# Patient Record
Sex: Female | Born: 1980 | Race: Black or African American | Hispanic: No | Marital: Single | Smoking: Current every day smoker
Health system: Southern US, Community
[De-identification: ages and names within clinical notes are randomized; demographics above are authoritative.]

## PROBLEM LIST (undated history)

## (undated) DIAGNOSIS — J40 Bronchitis, not specified as acute or chronic: Secondary | ICD-10-CM

---

## 2001-10-22 ENCOUNTER — Emergency Department (HOSPITAL_COMMUNITY): Admission: EM | Admit: 2001-10-22 | Discharge: 2001-10-22 | Payer: Self-pay | Admitting: Emergency Medicine

## 2017-12-02 ENCOUNTER — Emergency Department (HOSPITAL_COMMUNITY)
Admission: EM | Admit: 2017-12-02 | Discharge: 2017-12-03 | Disposition: A | Discharge status: Home / Self Care | Payer: Self-pay | Attending: Emergency Medicine | Admitting: Emergency Medicine

## 2017-12-02 ENCOUNTER — Encounter (HOSPITAL_COMMUNITY): Payer: Self-pay | Admitting: Emergency Medicine

## 2017-12-02 ENCOUNTER — Other Ambulatory Visit: Payer: Self-pay

## 2017-12-02 DIAGNOSIS — F17200 Nicotine dependence, unspecified, uncomplicated: Secondary | ICD-10-CM | POA: Insufficient documentation

## 2017-12-02 DIAGNOSIS — L0231 Cutaneous abscess of buttock: Secondary | ICD-10-CM | POA: Insufficient documentation

## 2017-12-02 HISTORY — DX: Bronchitis, not specified as acute or chronic: J40

## 2017-12-02 MED ORDER — LIDOCAINE-EPINEPHRINE (PF) 2 %-1:200000 IJ SOLN
10.0000 mL | Freq: Once | INTRAMUSCULAR | Status: AC
  Administered 2017-12-02: 10 mL
  Filled 2017-12-02: qty 20

## 2017-12-02 NOTE — ED Provider Notes (Signed)
[facility] Provider Note   CSN: [id number] Arrival date & time: 12/02/17  2000     History   Chief Complaint Chief Complaint  Patient presents with  . Abscess    HPI Terry Ortiz is a 36 y.o. female to the ED with acute onset of abscess to left buttock that she noticed yesterday.  Patient states she soaked in warm water and Epsom salt, however today has become more painful.  Denies fever, chills, nausea, vomiting.  Denies pain with defecation.  States she is only had one abscess in the past, and it was in her left axilla.  No other complaints.  The history is provided by the patient.    Past Medical History:  Diagnosis Date  . Bronchitis     There are no active problems to display for this patient.   History reviewed. No pertinent surgical history.  OB History    No data available       Home Medications    Prior to Admission medications   Not on File    Family History No family history on file.  Social History Social History   Tobacco Use  . Smoking status: Current Every Day Smoker  . Smokeless tobacco: Never Used  Substance Use Topics  . Alcohol use: Yes  . Drug use: Not on file     Allergies   Patient has no known allergies.   Review of Systems Review of Systems  Constitutional: Negative for chills and fever.  Gastrointestinal:       No pain with defecation  Skin: Positive for color change.     Physical Exam Updated Vital Signs BP (!) 125/93 (BP Location: Right Arm)   Pulse 87   Temp 98.3 F (36.8 C) (Oral)   Resp 18   Ht 5\' 7"  (1.702 m)   LMP 11/25/2017   SpO2 100%   Physical Exam  Constitutional: She appears well-developed and well-nourished.  HENT:  Head: Normocephalic and atraumatic.  Eyes: Conjunctivae are normal.  Cardiovascular: Intact distal pulses.  Pulmonary/Chest: Effort normal.  Genitourinary:     Genitourinary Comments: Then performed with female chaperone present.   Tender, erythematous, fluctuant mass to left gluteal cleft about 3 inches away from anus.  Not actively draining.  Psychiatric: She has a normal mood and affect. Her behavior is normal.  Nursing note and vitals reviewed.    ED Treatments / Results  Labs (all labs ordered are listed, but only abnormal results are displayed) Labs Reviewed - No data to display  EKG  EKG Interpretation None       Radiology No results found.  Procedures .Marland Kitchen.Incision and Drainage Date/Time: 12/03/2017 12:57 AM Performed by: Robinson, [place] N, PA-C Authorized by: Robinson, [place] N, PA-C   Consent:    Consent obtained:  Verbal   Consent given by:  Patient   Risks discussed:  Bleeding, incomplete drainage and pain   Alternatives discussed:  No treatment and alternative treatment Location:    Type:  Abscess   Size:  2cm   Location:  Anogenital   Anogenital location:  Gluteal cleft Pre-procedure details:    Skin preparation:  Betadine Anesthesia (see MAR for exact dosages):    Anesthesia method:  Local infiltration   Local anesthetic:  Lidocaine 2% WITH epi Procedure type:    Complexity:  Simple Procedure details:    Incision types:  Single straight   Incision depth:  Dermal   Scalpel blade:  11   Wound  management:  Probed and deloculated and irrigated with saline   Drainage:  Bloody and purulent   Drainage amount:  Moderate   Wound treatment:  Wound left open   Packing materials:  None Post-procedure details:    Patient tolerance of procedure:  Tolerated well, no immediate complications   (including critical care time)  Medications Ordered in ED Medications  lidocaine-EPINEPHrine (XYLOCAINE W/EPI) 2 %-1:200000 (PF) injection 10 mL (10 mLs Infiltration Given 12/02/17 2358)     Initial Impression / Assessment and Plan / ED Course  I have reviewed the triage vital signs and the nursing notes.  Pertinent labs & imaging results that were available during my care of the patient were  reviewed by me and considered in my medical decision making (see chart for details).     Patient with skin abscess amenable to incision and drainage.  Abscess was not large enough to warrant packing or drain,  wound recheck in 2 days. Encouraged home warm soaks and flushing.  Mild signs of cellulitis is surrounding skin.  Will d/c to home.  No antibiotic therapy is indicated.  Patient discussed with and seen by Dr. Erma HeritageIsaacs.  Discussed results, findings, treatment and follow up. Patient advised of return precautions. Patient verbalized understanding and agreed with plan.   Final Clinical Impressions(s) / ED Diagnoses   Final diagnoses:  Abscess of buttock, left    ED Discharge Orders    None       Robinson, [place] N, [place]-C 12/03/17 0059    Shaune PollackIsaacs, [place], MD 12/03/17 [phone]

## 2017-12-02 NOTE — ED Notes (Signed)
See provider assessment 

## 2017-12-02 NOTE — ED Triage Notes (Signed)
Pt c/o abscess to left buttock that she noticed yesterday, soaked area in warm water and epsom salt with no relief.

## 2017-12-03 NOTE — Discharge Instructions (Signed)
Please read instructions below.  °Keep your wound clean and covered. °Soak/flush your wound with warm water, multiple times per day. °You can take Advil/ibuprofen every 6 hours as needed for pain. °Follow up with your primary care or urgent care for wound recheck in 2 days.  °Return to the ER for fever, worsening redness, or new or worsening symptoms. ° °

## 2020-10-26 ENCOUNTER — Emergency Department (HOSPITAL_COMMUNITY): Payer: Self-pay

## 2020-10-26 ENCOUNTER — Encounter (HOSPITAL_COMMUNITY): Payer: Self-pay | Admitting: Emergency Medicine

## 2020-10-26 ENCOUNTER — Emergency Department (HOSPITAL_COMMUNITY)
Admission: EM | Admit: 2020-10-26 | Discharge: 2020-10-26 | Disposition: A | Discharge status: Home / Self Care | Payer: Self-pay | Attending: Emergency Medicine | Admitting: Emergency Medicine

## 2020-10-26 DIAGNOSIS — R42 Dizziness and giddiness: Secondary | ICD-10-CM | POA: Insufficient documentation

## 2020-10-26 DIAGNOSIS — Z5321 Procedure and treatment not carried out due to patient leaving prior to being seen by health care provider: Secondary | ICD-10-CM | POA: Insufficient documentation

## 2020-10-26 DIAGNOSIS — R079 Chest pain, unspecified: Secondary | ICD-10-CM | POA: Insufficient documentation

## 2020-10-26 DIAGNOSIS — Z20822 Contact with and (suspected) exposure to covid-19: Secondary | ICD-10-CM | POA: Insufficient documentation

## 2020-10-26 DIAGNOSIS — R0602 Shortness of breath: Secondary | ICD-10-CM | POA: Insufficient documentation

## 2020-10-26 LAB — BASIC METABOLIC PANEL
Anion gap: 15 (ref 5–15)
BUN: 11 mg/dL (ref 6–20)
CO2: 17 mmol/L — ABNORMAL LOW (ref 22–32)
Calcium: 9.7 mg/dL (ref 8.9–10.3)
Chloride: 107 mmol/L (ref 98–111)
Creatinine, Ser: 0.91 mg/dL (ref 0.44–1.00)
GFR, Estimated: >60 mL/min (ref >60)
Glucose, Bld: 95 mg/dL (ref 70–99)
Potassium: 3.5 mmol/L (ref 3.5–5.1)
Sodium: 139 mmol/L (ref 135–145)

## 2020-10-26 LAB — CBC
HCT: 39.6 % (ref 36.0–46.0)
Hemoglobin: 13 g/dL (ref 12.0–15.0)
MCH: 28.8 pg (ref 26.0–34.0)
MCHC: 32.8 g/dL (ref 30.0–36.0)
MCV: 87.8 fL (ref 80.0–100.0)
Platelets: 369 10*3/uL (ref 150–400)
RBC: 4.51 MIL/uL (ref 3.87–5.11)
RDW: 13.2 % (ref 11.5–15.5)
WBC: 7.7 10*3/uL (ref 4.0–10.5)
nRBC: 0 % (ref 0.0–0.2)

## 2020-10-26 LAB — RESPIRATORY PANEL BY RT PCR (FLU A&B, COVID)
Influenza A by PCR: NEGATIVE
Influenza B by PCR: NEGATIVE
SARS Coronavirus 2 by RT PCR: NEGATIVE

## 2020-10-26 LAB — I-STAT BETA HCG BLOOD, ED (MC, WL, AP ONLY): I-stat hCG, quantitative: <5 m[IU]/mL (ref <5)

## 2020-10-26 LAB — TROPONIN I (HIGH SENSITIVITY): Troponin I (High Sensitivity): <2 ng/L (ref <18)

## 2020-10-26 NOTE — ED Triage Notes (Signed)
Pt endorses SOB since Thursday. No relief with inhaler or mucinex. Endorses CP and lightheadedness. Pt very anxious and states it feels better when standing.

## 2020-10-26 NOTE — ED Notes (Signed)
Pt states she is leaving  

## 2021-07-13 ENCOUNTER — Emergency Department (HOSPITAL_COMMUNITY)
Admission: EM | Admit: 2021-07-13 | Discharge: 2021-07-13 | Discharge status: Against Medical Advice | Payer: Self-pay | Attending: Emergency Medicine | Admitting: Emergency Medicine

## 2021-07-13 ENCOUNTER — Other Ambulatory Visit: Payer: Self-pay

## 2021-07-13 ENCOUNTER — Encounter (HOSPITAL_COMMUNITY): Payer: Self-pay

## 2021-07-13 ENCOUNTER — Emergency Department (HOSPITAL_COMMUNITY): Payer: Self-pay

## 2021-07-13 DIAGNOSIS — R202 Paresthesia of skin: Secondary | ICD-10-CM | POA: Insufficient documentation

## 2021-07-13 DIAGNOSIS — Z5321 Procedure and treatment not carried out due to patient leaving prior to being seen by health care provider: Secondary | ICD-10-CM | POA: Insufficient documentation

## 2021-07-13 NOTE — ED Provider Notes (Signed)
Emergency Medicine Provider Triage Evaluation Note  Terry Ortiz , a 40 y.o. female  was evaluated in triage.  Pt complains of 3 months of progressively enlarging lump on right shoulder, now painful with tingling down the right arm. No injuries. Relieved with ibuprofen, worsening with repetitive movement.   Review of Systems  Positive: Shoulder pain, swelling, tingling/numbness in arm Negative: Weakness  Physical Exam  There were no vitals taken for this visit. Gen:   Awake, no distress   Resp:  Normal effort  MSK:   Moves extremities without difficulty  Other:  Soft tissue mass over the right posterior shoulder, mildly TTP. 2+ radial pulses,   Medical Decision Making  Medically screening exam initiated at 2:50 PM.  Appropriate orders placed.  Avantika Shere was informed that the remainder of the evaluation will be completed by another provider, this initial triage assessment does not replace that evaluation, and the importance of remaining in the ED until their evaluation is complete.  This chart was dictated using voice recognition software, Dragon. Despite the best efforts of this provider to proofread and correct errors, errors may still occur which can change documentation meaning.    Sherrilee Gilles 07/13/21 1454    Gloris Manchester, MD 07/13/21 [phone]

## 2021-07-13 NOTE — ED Triage Notes (Signed)
Pt reports knot to right shoulder x3 months and reports numbness with movement of right arm.

## 2023-02-13 IMAGING — CR DG SHOULDER 2+V*R*
3 series · 3 of 3 positions shown · non-contrast
Comparison: None.

CLINICAL DATA: Progressive enlargement of mass on right superior
posterior shoulder, now painful with numbness and tingling rating
down right arm.

EXAM:
RIGHT SHOULDER - 2+ VIEW

[w shoulder external right]
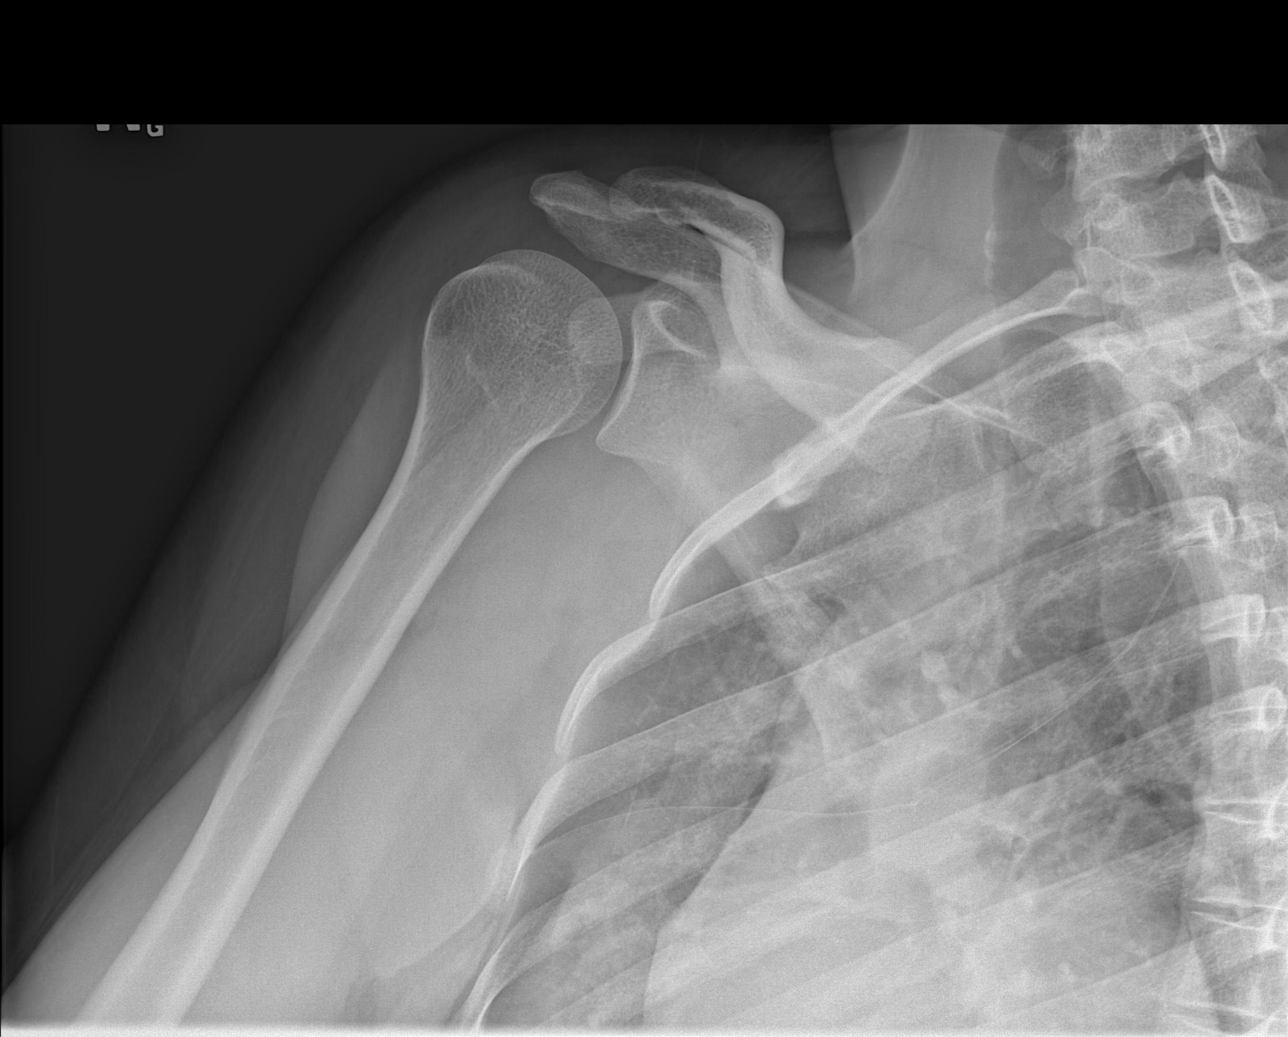

[w shoulder y-view right]
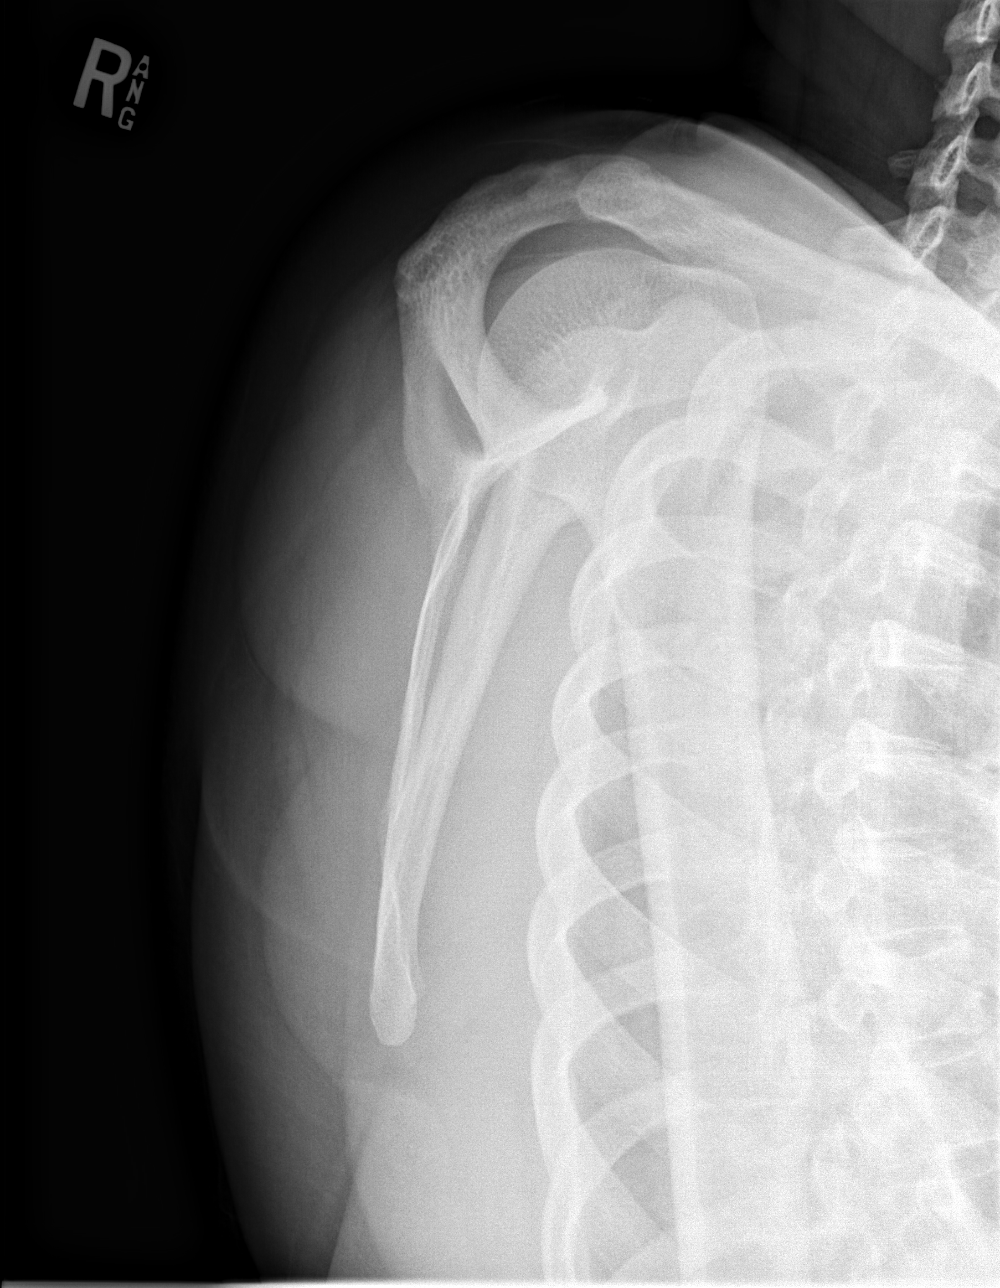

[x shoulder axillary right]
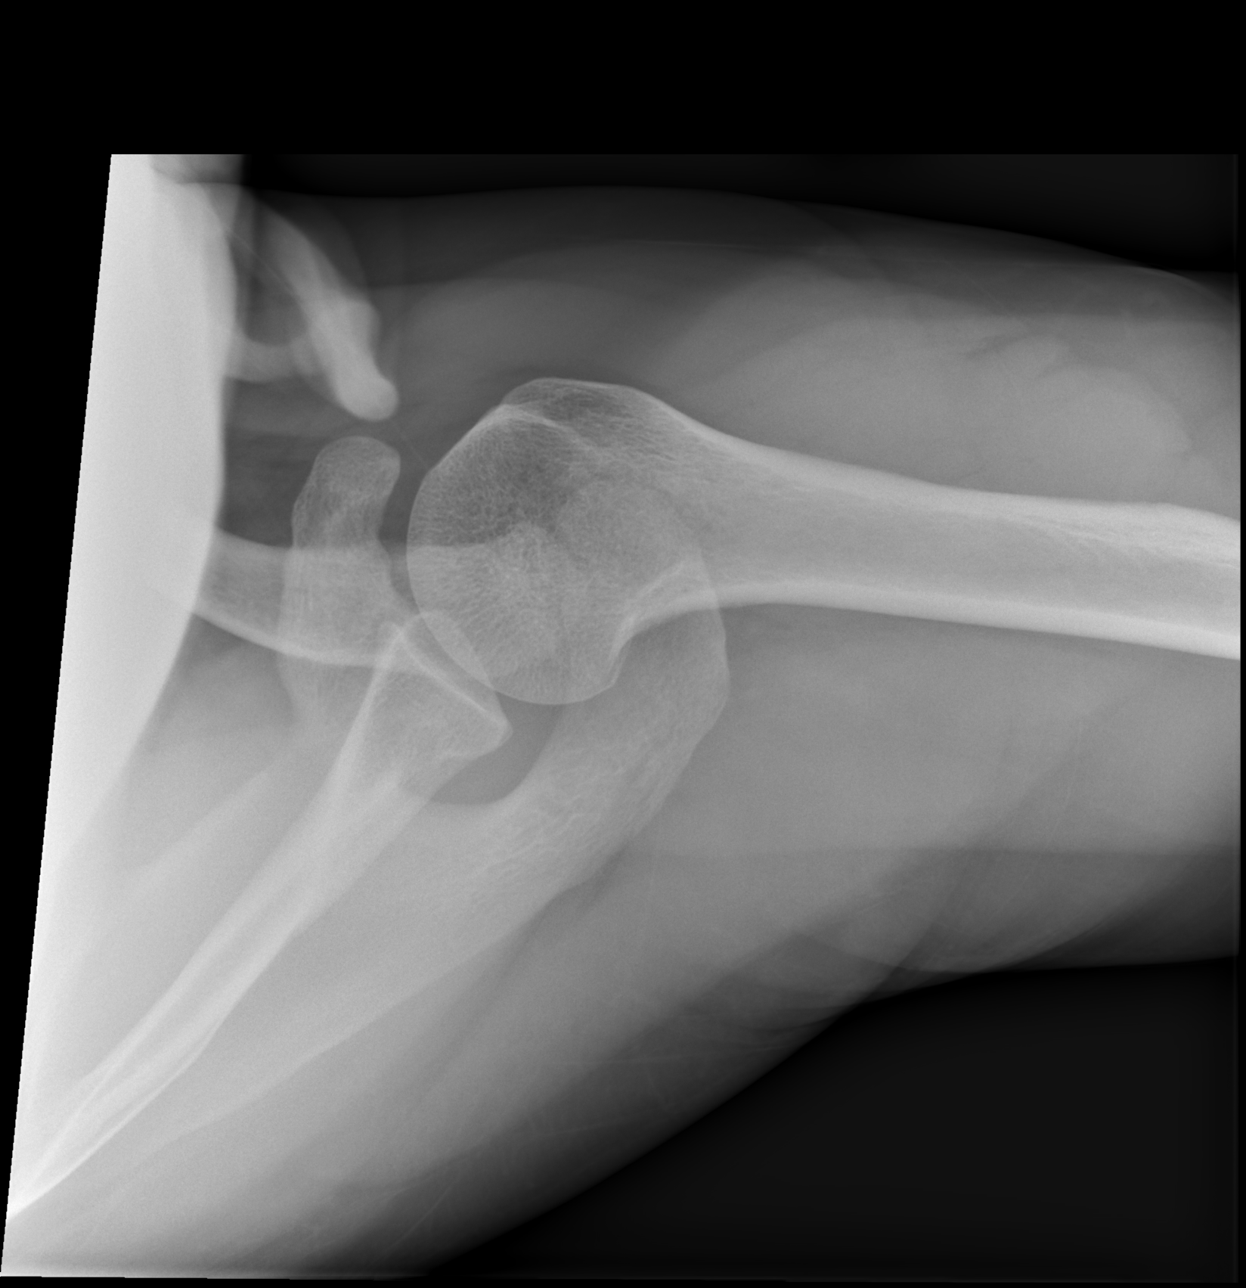

[3 of 3 positions shown; findings below may reference images not displayed]

FINDINGS: There is no evidence of fracture or dislocation. There is no
evidence of arthropathy or other focal bone abnormality. Soft
tissues are unremarkable. Visualized lung fields are clear.
IMPRESSION: No osseous abnormality or soft tissue mineralization visualized. Of
note, radiographs are relatively insensitive for soft tissue masses.
If patient has a palpable abnormality, consider further evaluation
with dedicated US or MRI.

## 2023-03-04 ENCOUNTER — Emergency Department (HOSPITAL_COMMUNITY)
Admission: EM | Admit: 2023-03-04 | Discharge: 2023-03-04 | Disposition: A | Discharge status: Home / Self Care | Payer: Self-pay | Attending: Emergency Medicine | Admitting: Emergency Medicine

## 2023-03-04 ENCOUNTER — Other Ambulatory Visit: Payer: Self-pay

## 2023-03-04 DIAGNOSIS — R000 Tachycardia, unspecified: Secondary | ICD-10-CM | POA: Insufficient documentation

## 2023-03-04 DIAGNOSIS — U071 COVID-19: Secondary | ICD-10-CM | POA: Insufficient documentation

## 2023-03-04 LAB — RESP PANEL BY RT-PCR (RSV, FLU A&B, COVID)  RVPGX2
Influenza A by PCR: NEGATIVE
Influenza B by PCR: NEGATIVE
Resp Syncytial Virus by PCR: NEGATIVE
SARS Coronavirus 2 by RT PCR: POSITIVE — AB

## 2023-03-04 MED ORDER — ACETAMINOPHEN 500 MG PO TABS
1000.0000 mg | ORAL_TABLET | Freq: Once | ORAL | Status: AC
  Administered 2023-03-04: 1000 mg via ORAL
  Filled 2023-03-04: qty 2

## 2023-03-04 NOTE — ED Provider Notes (Signed)
[facility] AT [facility] Provider Note   CSN: [id number] Arrival date & time: 03/04/23  1802     History  Chief Complaint  Patient presents with   Headache    Terry Ortiz is a 42 y.o. female who presents to the ED with concerns for headache onset today. 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 Has associated generalized body aches, fever, chills, rhinorrhea, nasal congestion. Tried OTC antipyretics at home for her symptoms. Denies chest pain, shortness of breath, sore throat, trouble swallowing.   The history is provided by the patient. No language interpreter was used.       Home Medications Prior to Admission medications   Not on File      Allergies    Patient has no known allergies.    Review of Systems   Review of Systems  Neurological:  Positive for headaches.  All other systems reviewed and are negative.   Physical Exam Updated Vital Signs BP (!) 148/112   Pulse (!) 102   Temp 100.3 F (37.9 C)   Resp 18   SpO2 100%  Physical Exam Vitals and nursing note reviewed.  Constitutional:      General: She is not in acute distress.    Appearance: Normal appearance.  Eyes:     General: No scleral icterus.    Extraocular Movements: Extraocular movements intact.  Cardiovascular:     Rate and Rhythm: Regular rhythm. Tachycardia present.     Pulses: Normal pulses.     Heart sounds: Normal heart sounds.  Pulmonary:     Effort: Pulmonary effort is normal. No respiratory distress.     Breath sounds: Normal breath sounds.  Abdominal:     Palpations: Abdomen is soft. There is no mass.     Tenderness: There is no abdominal tenderness.  Musculoskeletal:        General: Normal range of motion.     Cervical back: Neck supple.  Skin:    General: Skin is warm and dry.     Findings: No rash.  Neurological:     Mental Status: She is alert.     Sensory: Sensation is intact.      Motor: Motor function is intact.  Psychiatric:        Behavior: Behavior normal.     ED Results / Procedures / Treatments   Labs (all labs ordered are listed, but only abnormal results are displayed) Labs Reviewed  RESP PANEL BY RT-PCR (RSV, FLU A&B, COVID)  [id number] - Abnormal; Notable for the following components:      Result Value   SARS Coronavirus 2 by RT PCR POSITIVE (*)    All other components within normal limits    EKG None  Radiology No results found.  Procedures Procedures    Medications Ordered in ED Medications  acetaminophen (TYLENOL) tablet 1,000 mg (1,000 mg Oral Given 03/04/23 1914)    ED Course/ Medical Decision Making/ A&P Clinical Course as of 03/04/23 2116  Tue Mar 04, 2023  2045 SARS Coronavirus 2 by RT PCR(!): POSITIVE [SB]  2053 Re-evaluated and resting comfortably on stretcher. Discussed swab and discharge treatment plan. Pt agreeable at this time. Pt appears safe for discharge. [SB]    Clinical Course User Index [SB] Markale Birdsell A, PA-C  Medical Decision Making Amount and/or Complexity of Data Reviewed Labs:  Decision-making details documented in ED Course.  Risk OTC drugs.   Pt presents with headache and generalized body aches onset today. Sick contacts at home with COVID. Vital signs, pt afebrile. On exam, pt with tachycardia otherwise, no acute cardiovascular, respiratory, abdominal exam findings. Differential diagnosis includes COVID, flu, PNA, viral URI with cough.    Labs:  I ordered, and personally interpreted labs.  The pertinent results include:   COVID swab positive RSV and Flu swab negative  Medications:  I ordered medication including tylenol for symptom management  Reevaluation of the patient after these medicines and interventions, I reevaluated the patient and found that they have improved I have reviewed the patients home medicines and have made adjustments as  needed    Disposition: Pt presentation suspicious for COVID-19. Doubt flu, PNA, or viral URI with cough at this time. After consideration of the diagnostic results and the patients response to treatment, I feel that the patient would benefit from Discharge home. Work note provided. Instructed to follow up with PCP. Pt ambulatory at discharge without assistance or difficulty. Supportive care measures and strict return precautions discussed with patient at bedside. Pt acknowledges and verbalizes understanding. Pt appears safe for discharge. Follow up as indicated in discharge paperwork.    This chart was dictated using voice recognition software, Dragon. Despite the best efforts of this provider to proofread and correct errors, errors may still occur which can change documentation meaning.    Final Clinical Impression(s) / ED Diagnoses Final diagnoses:  [id number]    Rx / DC Orders ED Discharge Orders     None         Dutch Ing A, PA-C 03/04/23 2116    Audley Hose, MD 03/05/23 0127

## 2023-03-04 NOTE — ED Triage Notes (Signed)
C/o headache, fever, congestion, bodyaches, chills that started this am. Mucinex w/o relief.  Pt reports son is sick and client at work is covid +

## 2023-03-04 NOTE — Discharge Instructions (Signed)
It was a pleasure taking care of you!    Your COVID swab was positive for COVID.  Your Flu and RSV swab was negative. You may take over-the-counter cough and cold medications as needed for your symptoms. Ensure to maintain fluid intake with tea, soup, broth, Pedialyte, Gatorade, water. You may follow-up with your primary care provider as needed.  Return to the Emergency Department if you are experiencing trouble breathing, chest pain, decreased fluid intake or worsening symptoms.
